# Patient Record
Sex: Male | Born: 2000 | Race: Black or African American | Hispanic: No | Marital: Single
Health system: Southern US, Community
[De-identification: ages and names within clinical notes are randomized; demographics above are authoritative.]

---
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
## 2019-08-16 ENCOUNTER — Ambulatory Visit: Payer: Self-pay | Attending: Internal Medicine

## 2019-08-16 DIAGNOSIS — Z20822 Contact with and (suspected) exposure to covid-19: Secondary | ICD-10-CM | POA: Insufficient documentation

## 2019-08-17 LAB — NOVEL CORONAVIRUS, NAA: SARS-CoV-2, NAA: NOT DETECTED

## 2022-09-23 ENCOUNTER — Ambulatory Visit (INDEPENDENT_AMBULATORY_CARE_PROVIDER_SITE_OTHER): Payer: BC Managed Care – PPO

## 2022-09-23 ENCOUNTER — Ambulatory Visit
Admission: EM | Admit: 2022-09-23 | Discharge: 2022-09-23 | Disposition: A | Discharge status: Home / Self Care | Payer: BC Managed Care – PPO | Attending: Urgent Care | Admitting: Urgent Care

## 2022-09-23 DIAGNOSIS — S93401A Sprain of unspecified ligament of right ankle, initial encounter: Secondary | ICD-10-CM | POA: Diagnosis not present

## 2022-09-23 DIAGNOSIS — M25571 Pain in right ankle and joints of right foot: Secondary | ICD-10-CM

## 2022-09-23 DIAGNOSIS — Y9367 Activity, basketball: Secondary | ICD-10-CM

## 2022-09-23 MED ORDER — NAPROXEN 500 MG PO TABS: 500.0000 mg | ORAL_TABLET | Freq: Two times a day (BID) | ORAL | 0 refills | Status: AC

## 2022-09-23 NOTE — ED Triage Notes (Signed)
Pt states he injured right ankle playing basketball yesterday-NAD-limping gait

## 2022-09-23 NOTE — ED Provider Notes (Signed)
[facility]  Note:  This document was prepared using Systems analyst and may include unintentional dictation errors.  MRN: [id number] DOB: [birth year]  Subjective:   Jeremiah Sanchez is a 22 y.o. male presenting for right ankle pain and swelling.  Patient suffered an ankle sprain yesterday while playing basketball.  Has not taken anything for relief.  Has been able to bear weight on his ankle.  No chronic medications.  No Known Allergies  History reviewed. No pertinent past medical history.   History reviewed. No pertinent surgical history.  No family history on file.  Social History   Tobacco Use   Smoking status: Never   Smokeless tobacco: Never  Vaping Use   Vaping Use: Never used  Substance Use Topics   Alcohol use: No   Drug use: Never    ROS   Objective:   Vitals: BP 115/75 (BP Location: Right Arm)   Pulse 61   Temp 99.3 F (37.4 C) (Oral)   Resp 16   SpO2 96%   Physical Exam Constitutional:      General: He is not in acute distress.    Appearance: Normal appearance. He is well-developed and normal weight. He is not ill-appearing, toxic-appearing or diaphoretic.  HENT:     Head: Normocephalic and atraumatic.     Right Ear: External ear normal.     Left Ear: External ear normal.     Nose: Nose normal.     Mouth/Throat:     Pharynx: Oropharynx is clear.  Eyes:     General: No scleral icterus.       Right eye: No discharge.        Left eye: No discharge.     Extraocular Movements: Extraocular movements intact.  Cardiovascular:     Rate and Rhythm: Normal rate.  Pulmonary:     Effort: Pulmonary effort is normal.  Musculoskeletal:     Cervical back: Normal range of motion.     Right ankle: Swelling present. No deformity, ecchymosis or lacerations. Tenderness present over the lateral malleolus, ATF ligament and AITF ligament. No medial malleolus, CF ligament, posterior TF ligament or base of 5th metatarsal  tenderness. Decreased range of motion.     Right Achilles Tendon: No tenderness or defects. Thompson's test negative.  Neurological:     Mental Status: He is alert and oriented to person, place, and time.  Psychiatric:        Mood and Affect: Mood normal.        Behavior: Behavior normal.        Thought Content: Thought content normal.        Judgment: Judgment normal.     DG Ankle Complete Right  Result Date: 09/23/2022 CLINICAL DATA:  Right ankle pain, swelling. Injury playing basketball yesterday. EXAM: RIGHT ANKLE - COMPLETE 3+ VIEW COMPARISON:  None Available. FINDINGS: No acute fracture or dislocation. There may be a small ankle joint effusion. Soft tissue edema is most prominent laterally. Ankle mortise is preserved. Intact talar dome and base of the fifth metatarsal. Suggestion of pes planus, only partially included. IMPRESSION: Soft tissue edema and possible small joint effusion. No acute fracture or dislocation. Electronically Signed   By: Keith Rake M.D.   On: 09/23/2022 18:52    Right ankle wrapped using 4" Ace wrap in figure-8 method.   Assessment and Plan :   PDMP not reviewed this encounter.  1. Acute right ankle pain   2. Sprain of right  ankle, unspecified ligament, initial encounter     Will manage for ankle sprain with rice method, NSAID. Counseled patient on potential for adverse effects with medications prescribed/recommended today, ER and return-to-clinic precautions discussed, patient verbalized understanding.;   Jaynee Eagles, PA-C 09/24/22 [phone]
